# Patient Record
Sex: Male | Born: 1985 | Race: Black or African American | Hispanic: No | Marital: Single | Smoking: Current every day smoker
Health system: Southern US, Community
[De-identification: ages and names within clinical notes are randomized; demographics above are authoritative.]

## PROBLEM LIST (undated history)

## (undated) DIAGNOSIS — W3400XA Accidental discharge from unspecified firearms or gun, initial encounter: Secondary | ICD-10-CM

## (undated) DIAGNOSIS — S31139A Puncture wound of abdominal wall without foreign body, unspecified quadrant without penetration into peritoneal cavity, initial encounter: Secondary | ICD-10-CM

## (undated) HISTORY — PX: ABDOMINAL SURGERY: SHX537

---
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
## 2006-02-10 ENCOUNTER — Emergency Department (HOSPITAL_COMMUNITY): Admission: EM | Admit: 2006-02-10 | Discharge: 2006-02-11 | Payer: Self-pay | Admitting: Emergency Medicine
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
## 2011-01-20 ENCOUNTER — Encounter: Payer: Self-pay | Admitting: Emergency Medicine

## 2011-01-20 ENCOUNTER — Emergency Department (HOSPITAL_COMMUNITY)
Admission: EM | Admit: 2011-01-20 | Discharge: 2011-01-20 | Disposition: A | Discharge status: Home / Self Care | Payer: Self-pay | Attending: Emergency Medicine | Admitting: Emergency Medicine

## 2011-01-20 DIAGNOSIS — IMO0002 Reserved for concepts with insufficient information to code with codable children: Secondary | ICD-10-CM

## 2011-01-20 DIAGNOSIS — Z4802 Encounter for removal of sutures: Secondary | ICD-10-CM | POA: Insufficient documentation

## 2011-01-20 DIAGNOSIS — F17200 Nicotine dependence, unspecified, uncomplicated: Secondary | ICD-10-CM | POA: Insufficient documentation

## 2011-01-20 DIAGNOSIS — R109 Unspecified abdominal pain: Secondary | ICD-10-CM | POA: Insufficient documentation

## 2011-01-20 HISTORY — DX: Puncture wound of abdominal wall without foreign body, unspecified quadrant without penetration into peritoneal cavity, initial encounter: S31.139A

## 2011-01-20 HISTORY — DX: Accidental discharge from unspecified firearms or gun, initial encounter: W34.00XA

## 2011-01-20 LAB — COMPREHENSIVE METABOLIC PANEL
Alkaline Phosphatase: 61 U/L (ref 39–117)
BUN: 15 mg/dL (ref 6–23)
CO2: 28 mEq/L (ref 19–32)
Calcium: 10.3 mg/dL (ref 8.4–10.5)
GFR calc Af Amer: >90 mL/min (ref >90)
GFR calc non Af Amer: >90 mL/min (ref >90)
Glucose, Bld: 84 mg/dL (ref 70–99)
Total Protein: 8.5 g/dL — ABNORMAL HIGH (ref 6.0–8.3)

## 2011-01-20 LAB — DIFFERENTIAL
Eosinophils Absolute: 0.3 10*3/uL (ref 0.0–0.7)
Eosinophils Relative: 4 % (ref 0–5)
Lymphs Abs: 1.2 10*3/uL (ref 0.7–4.0)
Monocytes Absolute: 0.4 10*3/uL (ref 0.1–1.0)
Monocytes Relative: 6 % (ref 3–12)

## 2011-01-20 LAB — URINALYSIS, ROUTINE W REFLEX MICROSCOPIC
Ketones, ur: 15 mg/dL — AB
Leukocytes, UA: NEGATIVE
Nitrite: NEGATIVE
Specific Gravity, Urine: 1.02 (ref 1.005–1.030)
Urobilinogen, UA: 1 mg/dL (ref 0.0–1.0)
pH: 5.5 (ref 5.0–8.0)

## 2011-01-20 LAB — CBC
HCT: 42.4 % (ref 39.0–52.0)
Hemoglobin: 15 g/dL (ref 13.0–17.0)
MCH: 29.5 pg (ref 26.0–34.0)
MCV: 83.5 fL (ref 78.0–100.0)
RBC: 5.08 MIL/uL (ref 4.22–5.81)

## 2011-01-20 MED ORDER — HYDROMORPHONE HCL PF 1 MG/ML IJ SOLN
1.0000 mg | Freq: Once | INTRAMUSCULAR | Status: AC
  Administered 2011-01-20: 1 mg via INTRAVENOUS
  Filled 2011-01-20: qty 1

## 2011-01-20 MED ORDER — OXYCODONE-ACETAMINOPHEN 5-325 MG PO TABS: 1.0000 | ORAL_TABLET | Freq: Four times a day (QID) | ORAL | Status: DC | PRN

## 2011-01-20 MED ORDER — SODIUM CHLORIDE 0.9 % IV BOLUS (SEPSIS)
1000.0000 mL | Freq: Once | INTRAVENOUS | Status: AC
  Administered 2011-01-20: 1000 mL via INTRAVENOUS
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
## 2011-01-20 NOTE — ED Notes (Signed)
Given pt discharge paperwork. Went over discharge instructions. Educated pt to continue abx per orders, do not drive while on narcotics, and to come back to the ED for wound care.

## 2011-01-20 NOTE — ED Notes (Signed)
Received bedside repot from S. Filbert Schilder, [employer] fundraiser.  Patient currently sitting up in bed; no respiratory or acute distress noted.  Family present at bedside.  Patient provided urine sample; sent off to lab for analysis.  Introduced self to patient and updated whiteboard in room.  Patient requesting something to eat; Dr. Jeraldine Loots notified.  Patient has no other questions or concerns at this time.  Will continue to monitor.

## 2011-01-20 NOTE — ED Notes (Signed)
IV removed from left [facility] before patient discharge (IV insertion not documented by previous nurse).

## 2011-01-20 NOTE — ED Notes (Signed)
Pts IV was removed prior to discharge. IV was not documented.

## 2011-01-20 NOTE — ED Notes (Signed)
Bandages placed to the lower abd and back. Dressing dry and intact.
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
## 2011-01-27 ENCOUNTER — Emergency Department (HOSPITAL_COMMUNITY)
Admission: EM | Admit: 2011-01-27 | Discharge: 2011-01-27 | Disposition: A | Discharge status: Home / Self Care | Payer: Self-pay | Attending: Emergency Medicine | Admitting: Emergency Medicine

## 2011-01-27 ENCOUNTER — Encounter (HOSPITAL_COMMUNITY): Payer: Self-pay | Admitting: *Deleted

## 2011-01-27 DIAGNOSIS — Z4802 Encounter for removal of sutures: Secondary | ICD-10-CM | POA: Insufficient documentation

## 2011-01-27 DIAGNOSIS — Z5189 Encounter for other specified aftercare: Secondary | ICD-10-CM
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
## 2022-08-22 ENCOUNTER — Emergency Department (HOSPITAL_COMMUNITY): Payer: 59

## 2022-08-22 ENCOUNTER — Encounter (HOSPITAL_COMMUNITY): Payer: Self-pay | Admitting: *Deleted

## 2022-08-22 ENCOUNTER — Emergency Department (HOSPITAL_COMMUNITY)
Admission: EM | Admit: 2022-08-22 | Discharge: 2022-08-22 | Disposition: A | Discharge status: Home / Self Care | Payer: 59 | Attending: Student | Admitting: Student

## 2022-08-22 ENCOUNTER — Other Ambulatory Visit: Payer: Self-pay

## 2022-08-22 DIAGNOSIS — F1910 Other psychoactive substance abuse, uncomplicated: Secondary | ICD-10-CM

## 2022-08-22 DIAGNOSIS — R339 Retention of urine, unspecified: Secondary | ICD-10-CM | POA: Diagnosis not present

## 2022-08-22 DIAGNOSIS — R1030 Lower abdominal pain, unspecified: Secondary | ICD-10-CM | POA: Diagnosis not present

## 2022-08-22 DIAGNOSIS — K920 Hematemesis: Secondary | ICD-10-CM | POA: Insufficient documentation

## 2022-08-22 DIAGNOSIS — R109 Unspecified abdominal pain: Secondary | ICD-10-CM | POA: Diagnosis not present

## 2022-08-22 DIAGNOSIS — Z79899 Other long term (current) drug therapy: Secondary | ICD-10-CM | POA: Insufficient documentation

## 2022-08-22 DIAGNOSIS — F17210 Nicotine dependence, cigarettes, uncomplicated: Secondary | ICD-10-CM | POA: Insufficient documentation

## 2022-08-22 DIAGNOSIS — R1084 Generalized abdominal pain: Secondary | ICD-10-CM

## 2022-08-22 LAB — URINALYSIS, ROUTINE W REFLEX MICROSCOPIC
Bilirubin Urine: NEGATIVE
Glucose, UA: NEGATIVE mg/dL
Hgb urine dipstick: NEGATIVE
Ketones, ur: NEGATIVE mg/dL
Leukocytes,Ua: NEGATIVE
Nitrite: NEGATIVE
Protein, ur: NEGATIVE mg/dL
Specific Gravity, Urine: 1.011 (ref 1.005–1.030)
pH: 6 (ref 5.0–8.0)

## 2022-08-22 LAB — COMPREHENSIVE METABOLIC PANEL
ALT: 36 U/L (ref 0–44)
AST: 33 U/L (ref 15–41)
Albumin: 4.3 g/dL (ref 3.5–5.0)
Alkaline Phosphatase: 83 U/L (ref 38–126)
Anion gap: 12 (ref 5–15)
BUN: 13 mg/dL (ref 6–20)
CO2: 24 mmol/L (ref 22–32)
Calcium: 9.2 mg/dL (ref 8.9–10.3)
Chloride: 100 mmol/L (ref 98–111)
Creatinine, Ser: 0.89 mg/dL (ref 0.61–1.24)
GFR, Estimated: >60 mL/min (ref >60)
Glucose, Bld: 100 mg/dL — ABNORMAL HIGH (ref 70–99)
Potassium: 3.4 mmol/L — ABNORMAL LOW (ref 3.5–5.1)
Sodium: 136 mmol/L (ref 135–145)
Total Bilirubin: 0.9 mg/dL (ref 0.3–1.2)
Total Protein: 7.9 g/dL (ref 6.5–8.1)

## 2022-08-22 LAB — RAPID URINE DRUG SCREEN, HOSP PERFORMED
Amphetamines: POSITIVE — AB
Barbiturates: NOT DETECTED
Benzodiazepines: NOT DETECTED
Cocaine: POSITIVE — AB
Opiates: NOT DETECTED
Tetrahydrocannabinol: POSITIVE — AB

## 2022-08-22 LAB — CBC
HCT: 44.4 % (ref 39.0–52.0)
Hemoglobin: 15.1 g/dL (ref 13.0–17.0)
MCH: 29.5 pg (ref 26.0–34.0)
MCHC: 34 g/dL (ref 30.0–36.0)
MCV: 86.7 fL (ref 80.0–100.0)
Platelets: 270 10*3/uL (ref 150–400)
RBC: 5.12 MIL/uL (ref 4.22–5.81)
RDW: 15.4 % (ref 11.5–15.5)
WBC: 9.2 10*3/uL (ref 4.0–10.5)
nRBC: 0 % (ref 0.0–0.2)

## 2022-08-22 LAB — LIPASE, BLOOD: Lipase: 26 U/L (ref 11–51)

## 2022-08-22 MED ORDER — LORAZEPAM 2 MG/ML IJ SOLN
1.0000 mg | Freq: Once | INTRAMUSCULAR | Status: AC
  Administered 2022-08-22: 1 mg via INTRAVENOUS
  Filled 2022-08-22: qty 1

## 2022-08-22 MED ORDER — KETOROLAC TROMETHAMINE 15 MG/ML IJ SOLN
15.0000 mg | Freq: Once | INTRAMUSCULAR | Status: AC
  Administered 2022-08-22: 15 mg via INTRAMUSCULAR
  Filled 2022-08-22: qty 1

## 2022-08-22 MED ORDER — LIDOCAINE VISCOUS HCL 2 % MT SOLN
15.0000 mL | Freq: Once | OROMUCOSAL | Status: AC
  Administered 2022-08-22: 15 mL via ORAL
  Filled 2022-08-22: qty 15

## 2022-08-22 MED ORDER — LACTATED RINGERS IV BOLUS
1000.0000 mL | Freq: Once | INTRAVENOUS | Status: AC
  Administered 2022-08-22: 1000 mL via INTRAVENOUS

## 2022-08-22 MED ORDER — IOHEXOL 350 MG/ML SOLN
75.0000 mL | Freq: Once | INTRAVENOUS | Status: AC | PRN
  Administered 2022-08-22: 75 mL via INTRAVENOUS

## 2022-08-22 MED ORDER — ACETAMINOPHEN 500 MG PO TABS
1000.0000 mg | ORAL_TABLET | Freq: Once | ORAL | Status: AC
  Administered 2022-08-22: 1000 mg via ORAL
  Filled 2022-08-22: qty 2

## 2022-08-22 MED ORDER — ALUM & MAG HYDROXIDE-SIMETH 200-200-20 MG/5ML PO SUSP
30.0000 mL | Freq: Once | ORAL | Status: AC
  Administered 2022-08-22: 30 mL via ORAL
  Filled 2022-08-22: qty 30

## 2022-08-22 MED ORDER — DICYCLOMINE HCL 10 MG PO CAPS
20.0000 mg | ORAL_CAPSULE | Freq: Once | ORAL | Status: DC
  Filled 2022-08-22: qty 2

## 2022-08-22 NOTE — ED Notes (Signed)
EDP notified on patient's hypertension .  

## 2022-08-22 NOTE — Discharge Instructions (Signed)

## 2022-08-22 NOTE — ED Provider Notes (Signed)
Blood pressure (!) 171/85, pulse 73, temperature 98.3 F (36.8 C), temperature source Oral, resp. rate 17, SpO2 100%.  Assuming care from Dr. Posey Rea.  In short, Todd Hayden is a 37 y.o. male with a chief complaint of Abdominal Pain (Polysubstance abuse) .  Refer to the original H&P for additional details.  The current plan of care is to follow up on CT abdomen/pelvis.  07:48 AM  Independently interpreted the CT images and agree with radiology.  No acute findings.  Patient has been able to urinate without assistance on 2 occasions yielding 600 and 800 ml each.  Will not place a Foley catheter in the setting.  Discussed the incidental finding of kidney stone on the right but not obstructing or causing pain symptoms.  No evidence of UTI.  Patient will call for a sober ride home and appears sober and stable for discharge.   Maia Plan, MD 08/22/22 [phone]

## 2022-08-22 NOTE — ED Notes (Addendum)
Denies pain at this time /respirations unlabored , IV site intact , waiting for CT scan .

## 2022-08-22 NOTE — ED Triage Notes (Signed)
Pt having lower abd pressure, with NV after doing cocaine. Reports blood in his vomit.
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
## 2022-08-22 NOTE — ED Notes (Signed)
Patient transported to CT scan .
